# Patient Record
Sex: Female | Born: 1990 | Race: White | Hispanic: No | Marital: Single | Smoking: Never smoker
Health system: Southern US, Community
[De-identification: ages and names within clinical notes are randomized; demographics above are authoritative.]

## PROBLEM LIST (undated history)

## (undated) DIAGNOSIS — N289 Disorder of kidney and ureter, unspecified: Secondary | ICD-10-CM

## (undated) DIAGNOSIS — F419 Anxiety disorder, unspecified: Secondary | ICD-10-CM

## (undated) HISTORY — PX: KIDNEY SURGERY: SHX687

## (undated) HISTORY — PX: CLAVICLE SURGERY: SHX598

## (undated) HISTORY — PX: OTHER SURGICAL HISTORY: SHX169

## (undated) HISTORY — PX: APPENDECTOMY: SHX54

---

## 2012-11-24 ENCOUNTER — Encounter: Payer: Self-pay | Admitting: Emergency Medicine

## 2012-11-24 ENCOUNTER — Emergency Department (INDEPENDENT_AMBULATORY_CARE_PROVIDER_SITE_OTHER)
Admission: EM | Admit: 2012-11-24 | Discharge: 2012-11-24 | Disposition: A | Discharge status: Home / Self Care | Payer: Managed Care, Other (non HMO) | Source: Home / Self Care | Attending: Family Medicine | Admitting: Family Medicine

## 2012-11-24 DIAGNOSIS — N3000 Acute cystitis without hematuria: Secondary | ICD-10-CM

## 2012-11-24 DIAGNOSIS — R300 Dysuria: Secondary | ICD-10-CM

## 2012-11-24 HISTORY — DX: Disorder of kidney and ureter, unspecified: N28.9

## 2012-11-24 LAB — POCT URINALYSIS DIP (MANUAL ENTRY)
Nitrite, UA: NEGATIVE
Protein Ur, POC: NEGATIVE
pH, UA: 6 (ref 5–8)

## 2012-11-24 MED ORDER — PHENAZOPYRIDINE HCL 200 MG PO TABS: 200.0000 mg | ORAL_TABLET | Freq: Three times a day (TID) | ORAL | Status: DC

## 2012-11-24 MED ORDER — SULFAMETHOXAZOLE-TRIMETHOPRIM 800-160 MG PO TABS: 1.0000 | ORAL_TABLET | Freq: Two times a day (BID) | ORAL | Status: DC

## 2012-11-24 NOTE — ED Provider Notes (Signed)
History    CSN: [id number] Arrival date & time 11/24/12  1236  None    Chief Complaint  Patient presents with  . Dysuria      HPI Comments: Patient complains of mild dysuria, frequency, and hesitancy for two days.   She has a history of vesicoureteral reflux and recurrent UTI's.  Her last UTI was about 6 months ago.  No abdominal pain or fevers, chills, and sweats.  No nausea/vomiting   Patient is a 22 y.o. female presenting with frequency. The history is provided by the patient.  Urinary Frequency This is a recurrent problem. The current episode started 2 days ago. The problem occurs constantly. The problem has not changed since onset.Pertinent negatives include no abdominal pain. Nothing aggravates the symptoms. Nothing relieves the symptoms. Treatments tried: cranberry pills. The treatment provided no relief.   Past Medical History  Diagnosis Date  . Renal disorder    Past Surgical History  Procedure Laterality Date  . Appendectomy    . Spleenectomy     No family history on file. History  Substance Use Topics  . Smoking status: Never Smoker   . Smokeless tobacco: Not on file  . Alcohol Use: No   OB History   Grav Para Term Preterm Abortions TAB SAB Ect Mult Living                 Review of Systems  Gastrointestinal: Negative for abdominal pain.  Genitourinary: Positive for frequency.  All other systems reviewed and are negative.    Allergies  Review of patient's allergies indicates no known allergies.  Home Medications   Current Outpatient Rx  Name  Route  Sig  Dispense  Refill  . clonazePAM (KLONOPIN) 0.5 MG tablet   Oral   Take 0.5 mg by mouth 2 (two) times daily as needed for anxiety.         . phenazopyridine (PYRIDIUM) 200 MG tablet   Oral   Take 1 tablet (200 mg total) by mouth 3 (three) times daily. Take with food.   6 tablet   0   . sulfamethoxazole-trimethoprim (BACTRIM DS,SEPTRA DS) 800-160 MG per tablet   Oral   Take 1 tablet by mouth  2 (two) times daily.   10 tablet   0    BP 120/76  Pulse 79  Temp(Src) 98.3 F (36.8 C) (Oral)  Ht 5\' 5"  (1.651 m)  Wt 111 lb (50.349 kg)  BMI 18.47 kg/m2  SpO2 100%  LMP 11/22/2012 Physical Exam Nursing notes and Vital Signs reviewed. Appearance:  Patient appears healthy, stated age, and in no acute distress Eyes:  Pupils are equal, round, and reactive to light and accomodation.  Extraocular movement is intact.  Conjunctivae are not inflamed   Mouth/Pharynx:  Normal; moist mucous membranes  Neck:  Supple.   No adenopathy  Lungs:  Clear to auscultation.  Breath sounds are equal.  Heart:  Regular rate and rhythm without murmurs, rubs, or gallops.  Abdomen:  Nontender without masses or hepatosplenomegaly.  Bowel sounds are present.  No CVA or flank tenderness.  Extremities:  No edema.   Skin:  No rash present.   ED Course  Procedures  none  Labs Reviewed  URINE CULTURE pending  POCT URINALYSIS DIP (MANUAL ENTRY) BLO moderate; LEU trace      1. Dysuria   2. Acute cystitis     MDM  Begin Septra DS, Pyridium.  Urine culture pending.  Increase fluid intake. Followup with Family Doctor  if not improved in one week.   Lattie Haw, MD 11/24/12 [phone]

## 2012-11-24 NOTE — ED Notes (Signed)
Dysuria x 2 days.

## 2012-11-27 LAB — URINE CULTURE

## 2012-11-28 ENCOUNTER — Telehealth: Payer: Self-pay

## 2012-11-28 NOTE — Telephone Encounter (Signed)
Message copied by Chalmers Cater on Fri Nov 28, 2012  4:44 PM ------      Message from: Donna Christen A      Created: Fri Nov 28, 2012  4:42 PM       Stop Septra.  Call in Rx for Cipro 500mg , one BID for 5 days.  Rx #10, no ref.      ----- Message -----         From: Chalmers Cater, CMA         Sent: 11/28/2012  11:18 AM           To: Lattie Haw, MD            Tereasa is on her last day of the Septra and she is still having frequent urination and pressure during urination. She is unable to come in due to the fact she is traveling to the beach.        ------

## 2012-11-28 NOTE — ED Notes (Signed)
Sending a staff message:  She is still having frequent urination along with pressure during urination.

## 2012-11-28 NOTE — ED Notes (Addendum)
Patient advised and medication called in.     Left a message on voice mail asking patient to call back.      Stop Septra. Call in Rx for Cipro 500mg , one BID for 5 days. Rx #10, no ref. ----- Message ----- From: Chalmers Cater, CMA Sent: 11/28/2012 11:18 AM To: Lattie Haw, MD Kiki is on her last day of the Septra and she is still having frequent urination and pressure during urination. She is unable to come in due to the fact she is traveling to the beach.

## 2012-12-03 ENCOUNTER — Emergency Department (INDEPENDENT_AMBULATORY_CARE_PROVIDER_SITE_OTHER)
Admission: EM | Admit: 2012-12-03 | Discharge: 2012-12-03 | Disposition: A | Discharge status: Home / Self Care | Payer: Managed Care, Other (non HMO) | Source: Home / Self Care | Attending: Family Medicine | Admitting: Family Medicine

## 2012-12-03 ENCOUNTER — Encounter: Payer: Self-pay | Admitting: Emergency Medicine

## 2012-12-03 DIAGNOSIS — R300 Dysuria: Secondary | ICD-10-CM

## 2012-12-03 LAB — POCT URINALYSIS DIP (MANUAL ENTRY)
Bilirubin, UA: NEGATIVE
Glucose, UA: NEGATIVE
Ketones, POC UA: NEGATIVE
Leukocytes, UA: NEGATIVE
Nitrite, UA: NEGATIVE
pH, UA: 7 (ref 5–8)

## 2012-12-03 MED ORDER — NITROFURANTOIN MONOHYD MACRO 100 MG PO CAPS: 100.0000 mg | ORAL_CAPSULE | Freq: Two times a day (BID) | ORAL | Status: DC

## 2012-12-03 NOTE — ED Notes (Addendum)
Still having dysuria after having taken Bactrim and Cipro x 2 weeks

## 2012-12-03 NOTE — ED Provider Notes (Signed)
History    CSN: [id number] Arrival date & time 12/03/12  1437  First MD Initiated Contact with Patient 12/03/12 1514     Chief Complaint  Patient presents with  . Dysuria      HPI Comments: Patient reports continued dysuria after having taken Bactrim, and then Cipro (which she finished today).  No abdominal pain.  No fevers, chills, and sweats Patient's last menstrual period was 11/22/2012.  She notes that she has an IUD in place.  Patient is a 22 y.o. female presenting with dysuria. The history is provided by the patient.  Dysuria Pain quality:  Burning Pain severity:  Mild Onset quality:  Gradual Duration:  2 weeks Timing:  Constant Progression:  Unchanged Chronicity:  Recurrent Recent urinary tract infections: yes   Relieved by:  Nothing Ineffective treatments:  Antibiotics Urinary symptoms: frequent urination and hesitancy   Urinary symptoms: no discolored urine, no foul-smelling urine, no hematuria and no bladder incontinence   Associated symptoms: no abdominal pain, no fever, no flank pain, no genital lesions, no nausea, no vaginal discharge and no vomiting   Risk factors comment:  Hx of vesicoureteral reflux  Past Medical History  Diagnosis Date  . Renal disorder    Past Surgical History  Procedure Laterality Date  . Appendectomy    . Spleenectomy     No family history on file. History  Substance Use Topics  . Smoking status: Never Smoker   . Smokeless tobacco: Not on file  . Alcohol Use: No   OB History   Grav Para Term Preterm Abortions TAB SAB Ect Mult Living                 Review of Systems  Constitutional: Negative for fever.  Gastrointestinal: Negative for nausea, vomiting and abdominal pain.  Genitourinary: Positive for dysuria. Negative for flank pain and vaginal discharge.    Allergies  Rocephin  Home Medications   Current Outpatient Rx  Name  Route  Sig  Dispense  Refill  . clonazePAM (KLONOPIN) 0.5 MG tablet   Oral   Take 0.5 mg  by mouth 2 (two) times daily as needed for anxiety.         . nitrofurantoin, macrocrystal-monohydrate, (MACROBID) 100 MG capsule   Oral   Take 1 capsule (100 mg total) by mouth 2 (two) times daily.   14 capsule   0   . phenazopyridine (PYRIDIUM) 200 MG tablet   Oral   Take 1 tablet (200 mg total) by mouth 3 (three) times daily. Take with food.   6 tablet   0   . sulfamethoxazole-trimethoprim (BACTRIM DS,SEPTRA DS) 800-160 MG per tablet   Oral   Take 1 tablet by mouth 2 (two) times daily.   10 tablet   0    BP 126/77  Pulse 80  Temp(Src) 97.7 F (36.5 C) (Oral)  SpO2 99%  LMP 11/22/2012 Physical Exam Nursing notes and Vital Signs reviewed. Appearance:  Patient appears healthy, stated age, and in no acute distress Eyes:  Pupils are equal, round, and reactive to light and accomodation.  Extraocular movement is intact.  Conjunctivae are not inflamed  Pharynx:  Normal Neck:  Supple.  No adenopathy Lungs:  Clear to auscultation.  Breath sounds are equal.  Heart:  Regular rate and rhythm without murmurs, rubs, or gallops.  Abdomen:  Nontender without masses or hepatosplenomegaly.  Bowel sounds are present.  No CVA or flank tenderness.  Extremities:  No edema.  No calf tenderness  Skin:  No rash present.   ED Course  Procedures  None   Labs Reviewed  URINE CULTURE  POCT URINALYSIS DIP (MANUAL ENTRY)  BLO trace intact, otherwise negative  POCT URINE PREGNANCY negative    1. Dysuria, persistent.  History of vesicoureteral reflux     MDM  Begin Macrobid.  Urine culture pending Continue increased fluids.  May take AZO for two days. Followup with urologist  Lattie Haw, MD 12/03/12 2044

## 2012-12-05 ENCOUNTER — Telehealth: Payer: Self-pay | Admitting: Emergency Medicine

## 2012-12-05 LAB — URINE CULTURE: Colony Count: 5000

## 2013-02-11 ENCOUNTER — Emergency Department (INDEPENDENT_AMBULATORY_CARE_PROVIDER_SITE_OTHER)
Admission: EM | Admit: 2013-02-11 | Discharge: 2013-02-11 | Disposition: A | Discharge status: Home / Self Care | Payer: Managed Care, Other (non HMO) | Source: Home / Self Care | Attending: Family Medicine | Admitting: Family Medicine

## 2013-02-11 ENCOUNTER — Encounter: Payer: Self-pay | Admitting: *Deleted

## 2013-02-11 ENCOUNTER — Emergency Department (INDEPENDENT_AMBULATORY_CARE_PROVIDER_SITE_OTHER): Payer: Managed Care, Other (non HMO)

## 2013-02-11 DIAGNOSIS — S83511A Sprain of anterior cruciate ligament of right knee, initial encounter: Secondary | ICD-10-CM

## 2013-02-11 DIAGNOSIS — S83509A Sprain of unspecified cruciate ligament of unspecified knee, initial encounter: Secondary | ICD-10-CM

## 2013-02-11 DIAGNOSIS — M7520 Bicipital tendinitis, unspecified shoulder: Secondary | ICD-10-CM

## 2013-02-11 DIAGNOSIS — M7521 Bicipital tendinitis, right shoulder: Secondary | ICD-10-CM

## 2013-02-11 DIAGNOSIS — IMO0001 Reserved for inherently not codable concepts without codable children: Secondary | ICD-10-CM

## 2013-02-11 MED ORDER — HYDROCODONE-ACETAMINOPHEN 5-325 MG PO TABS: ORAL_TABLET | ORAL | Status: DC

## 2013-02-11 MED ORDER — MELOXICAM 7.5 MG PO TABS: 7.5000 mg | ORAL_TABLET | Freq: Every day | ORAL | Status: DC
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
## 2013-02-18 ENCOUNTER — Institutional Professional Consult (permissible substitution): Payer: Managed Care, Other (non HMO) | Admitting: Sports Medicine

## 2014-05-14 IMAGING — CR DG AC JOINTS*L*
2 series · 2 of 2 positions shown · non-contrast
Comparison: None.

CLINICAL DATA: Injured right clavicle yesterday with pain

LEFT ACROMIOCLAVICULAR JOINTS

[view not recorded (1 of 2)]
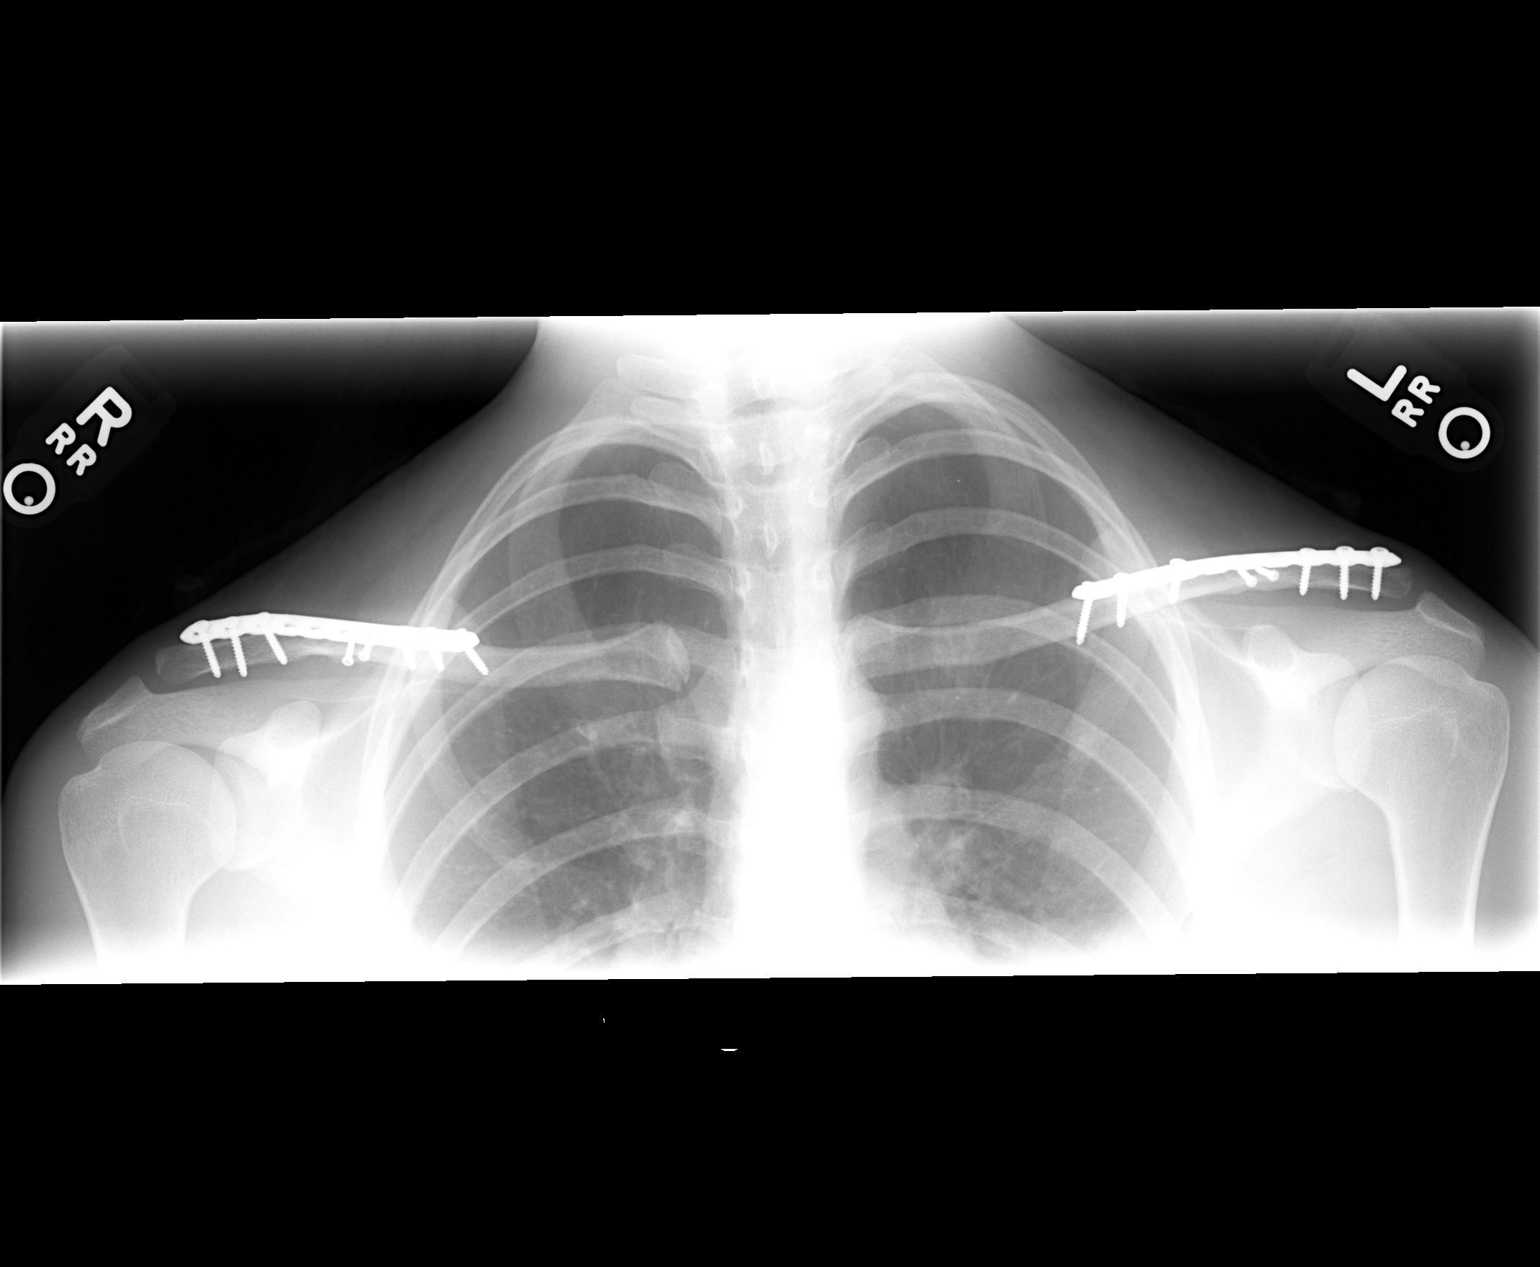

[view not recorded (2 of 2)]
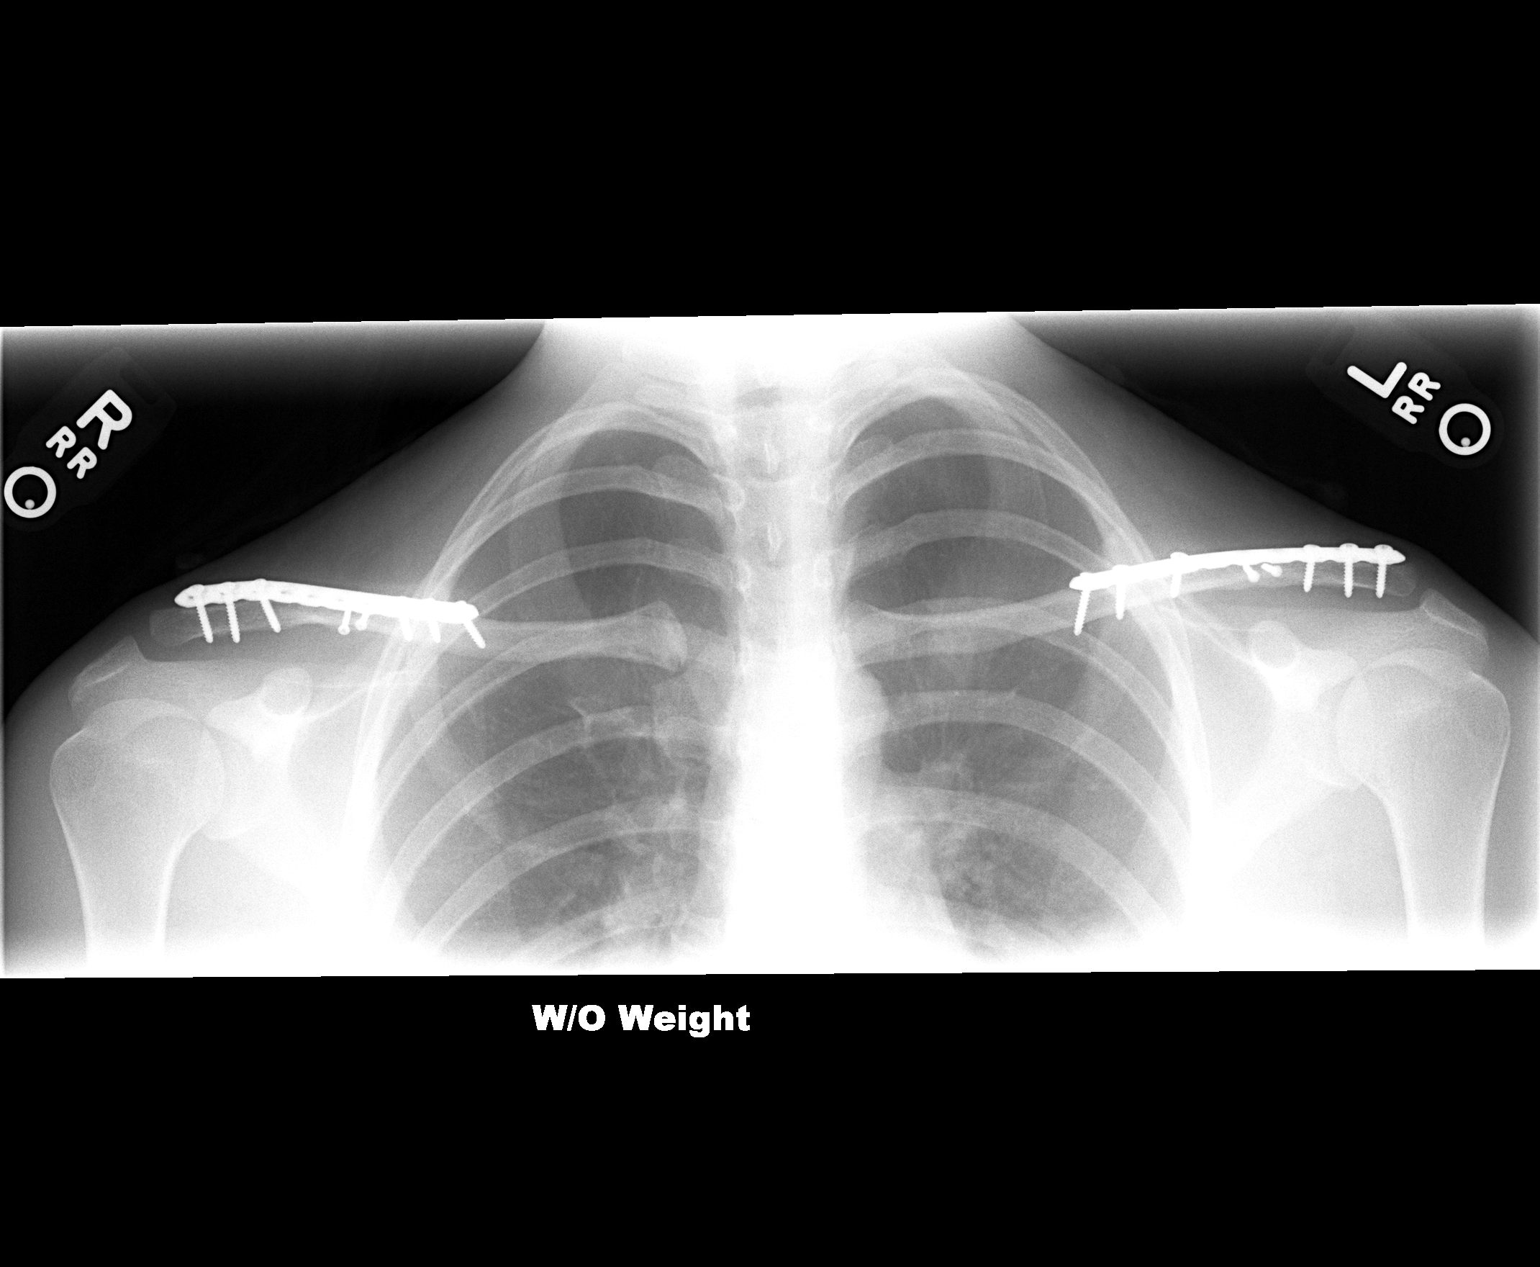

[2 of 2 positions shown; findings below may reference images not displayed]

FINDINGS: Views of the AC joint without and with weights were
obtained.  Metallic fixation plates are noted across both mid
distal clavicles for fixation of prior fractures.  Several screws
are loose within each of these fixation plates.  The AC joints
appear normally aligned.  There is no change in coracoclavicular
distances without and with weights.
IMPRESSION: Negative.

## 2014-10-07 ENCOUNTER — Encounter: Payer: Self-pay | Admitting: Emergency Medicine

## 2014-10-07 ENCOUNTER — Emergency Department
Admission: EM | Admit: 2014-10-07 | Discharge: 2014-10-07 | Disposition: A | Discharge status: Home / Self Care | Payer: Managed Care, Other (non HMO) | Source: Home / Self Care | Attending: Emergency Medicine | Admitting: Emergency Medicine

## 2014-10-07 DIAGNOSIS — A09 Infectious gastroenteritis and colitis, unspecified: Secondary | ICD-10-CM

## 2014-10-07 LAB — POCT INFLUENZA A/B
Influenza A, POC: NEGATIVE
Influenza B, POC: NEGATIVE

## 2014-10-07 MED ORDER — DIPHENOXYLATE-ATROPINE 2.5-0.025 MG PO TABS: 1.0000 | ORAL_TABLET | Freq: Four times a day (QID) | ORAL | Status: DC | PRN

## 2014-10-07 MED ORDER — FLUCONAZOLE 150 MG PO TABS: 150.0000 mg | ORAL_TABLET | Freq: Once | ORAL | Status: DC

## 2014-10-07 MED ORDER — CIPROFLOXACIN HCL 500 MG PO TABS: 500.0000 mg | ORAL_TABLET | Freq: Two times a day (BID) | ORAL | Status: DC

## 2014-10-07 NOTE — ED Notes (Addendum)
Reports onset nausea, vomiting, diarrhea and low grade fever 2 days ago; no further N&V but has had approximately 10 diarrhea stools in past 12 hours. Also has cough. Denies abdominal pain except for cramping with diarrhea.

## 2014-10-07 NOTE — ED Provider Notes (Signed)
CSN: [id number]     Arrival date & time 10/07/14  [id number] History   First MD Initiated Contact with Patient 10/07/14 [phone]     Chief Complaint  Patient presents with  . Diarrhea  . Code Sepsis    Patient is a 24 y.o. female presenting with diarrhea. The history is provided by the patient.  Diarrhea Quality:  Watery Severity:  Severe Onset quality:  Sudden Timing:  Unable to specify Progression:  Unchanged Relieved by:  Nothing Exacerbated by: Eating or drinking anything. Ineffective treatments: Imodium. Associated symptoms: abdominal pain (Episodic, cramping), chills, cough (Minimal), diaphoresis, fever, headaches (Nonfocal) and myalgias   Associated symptoms: no URI   Risk factors: sick contacts   Risk factors: no recent antibiotic use, no suspicious food intake and no travel to endemic areas    2 nights ago, acute nausea and vomiting loose watery diarrheal stools with fever up to 101 with chills and sweats and myalgias. Episodic bilateral lower abdominal crampy pain, mild. She denies chance of pregnancy as she has an IUD and just went to her GYN for annual exam about 5 days ago, and she states all routine tests and GYN exam were normal. Had 10 loose diarrhea stools in the past 12 hours. She is able to tolerate by mouth clear liquids and small amount of solids. Has mild nausea but vomiting resolved the past 12 hours. Has minimal nonproductive cough. No other URI symptoms. No shortness of breath or chest pain. Her 24-year-old son is in daycare, and he's been exposed to another child with febrile illness  Past Medical History  Diagnosis Date  . Renal disorder    Past Surgical History  Procedure Laterality Date  . Appendectomy    . Spleenectomy    . Cesarean section    . Clavicle surgery    . Kidney surgery     History reviewed. No pertinent family history. History  Substance Use Topics  . Smoking status: Never Smoker   . Smokeless tobacco: Never Used  . Alcohol Use: No   OB  History    No data available     Review of Systems  Constitutional: Positive for fever, chills and diaphoresis.  Gastrointestinal: Positive for abdominal pain (Episodic, cramping) and diarrhea.  Musculoskeletal: Positive for myalgias.  Neurological: Positive for headaches (Nonfocal).  Remainder of Review of Systems negative for acute change except as noted in the HPI. No urinary symptoms  Allergies  Rocephin  Home Medications   Prior to Admission medications   Medication Sig Start Date End Date Taking? Authorizing Provider  ciprofloxacin (CIPRO) 500 MG tablet Take 1 tablet (500 mg total) by mouth 2 (two) times daily. For 5 days 10/07/14   Lajean Manesavid Massey, MD  clonazePAM (KLONOPIN) 0.5 MG tablet Take 0.5 mg by mouth 2 (two) times daily as needed for anxiety.    Historical Provider, MD  diphenoxylate-atropine (LOMOTIL) 2.5-0.025 MG per tablet Take 1 tablet by mouth 4 (four) times daily as needed for diarrhea or loose stools. 10/07/14   Lajean Manesavid Massey, MD  fluconazole (DIFLUCAN) 150 MG tablet Take 1 tablet (150 mg total) by mouth once. Take 1 now, then may repeat x1 in 4 days, for yeast infection. 10/07/14   Lajean Manesavid Massey, MD   BP 103/69 mmHg  Pulse 108  Temp(Src) 98.1 F (36.7 C) (Oral)  Resp 16  Ht 5\' 4"  (1.626 m)  Wt 116 lb (52.617 kg)  BMI 19.90 kg/m2  SpO2 100% Physical Exam  Constitutional: She is oriented to person,  place, and time. She appears well-developed and well-nourished.  Non-toxic appearance. No distress.  Appears fatigued, but no acute distress. Pleasant, cooperative.  HENT:  Head: Normocephalic and atraumatic.  Nose: Nose normal.  Mouth/Throat: Oropharynx is clear and moist.  Oropharynx clear. Some moisture on mucous membranes. No lesions.  Eyes: Pupils are equal, round, and reactive to light. No scleral icterus.  Neck: Normal range of motion. Neck supple. No JVD present.  Cardiovascular: Normal rate, regular rhythm and normal heart sounds.   No murmur  heard. Pulmonary/Chest: Effort normal and breath sounds normal.  Abdominal: Soft. She exhibits no distension, no abdominal bruit and no mass. Bowel sounds are increased. There is no hepatosplenomegaly. There is tenderness (Minimal diffuse tenderness). There is no rebound, no guarding and no CVA tenderness.  Musculoskeletal: Normal range of motion. She exhibits no edema or tenderness.  Lymphadenopathy:    She has no cervical adenopathy.  Neurological: She is alert and oriented to person, place, and time.  Skin: No rash noted.  Psychiatric: She has a normal mood and affect.  Nursing note and vitals reviewed.   Urgent care Course 9:02 AM. She declined antinausea med in urgent care at this time. Rapid flu test ordered   Procedures (including critical care time) Labs Review Labs Reviewed  POCT INFLUENZA A/B   rapid flu tests negative   Imaging Review No results found.   MDM   1. Infectious diarrhea in adult patient    possibly viral cause, possibly bacterial. Clinically, she is not severely dehydrated and can oral rehydrate, and she denies any nausea now and can tolerate by mouth clear liquids. Treatment options discussed, as well as risks, benefits, alternatives. Patient voiced understanding and agreement with the following plans: Bacterial coverage with Cipro. Lomotil for severe diarrhea, but precautions discussed. Diflucan prescribed at her request in case she gets yeast infection from the antibiotic. New Prescriptions   CIPROFLOXACIN (CIPRO) 500 MG TABLET    Take 1 tablet (500 mg total) by mouth 2 (two) times daily. For 5 days   DIPHENOXYLATE-ATROPINE (LOMOTIL) 2.5-0.025 MG PER TABLET    Take 1 tablet by mouth 4 (four) times daily as needed for diarrhea or loose stools.   FLUCONAZOLE (DIFLUCAN) 150 MG TABLET    Take 1 tablet (150 mg total) by mouth once. Take 1 now, then may repeat x1 in 4 days, for yeast infection.   handout given on diarrhea. Follow-up with your primary care  doctor in 2-3 days if not improving. ER  if symptoms become worse or any red flags. Precautions discussed. Red flags discussed. Questions invited and answered. Patient voiced understanding and agreement.     Lajean Manesavid Massey, MD 10/07/14 [phone]

## 2015-06-16 ENCOUNTER — Encounter: Payer: Self-pay | Admitting: *Deleted

## 2015-06-16 ENCOUNTER — Emergency Department (INDEPENDENT_AMBULATORY_CARE_PROVIDER_SITE_OTHER)
Admission: EM | Admit: 2015-06-16 | Discharge: 2015-06-16 | Disposition: A | Discharge status: Home / Self Care | Payer: Managed Care, Other (non HMO) | Source: Home / Self Care | Attending: Family Medicine | Admitting: Family Medicine

## 2015-06-16 DIAGNOSIS — J069 Acute upper respiratory infection, unspecified: Secondary | ICD-10-CM | POA: Diagnosis not present

## 2015-06-16 DIAGNOSIS — J0100 Acute maxillary sinusitis, unspecified: Secondary | ICD-10-CM

## 2015-06-16 MED ORDER — AMOXICILLIN-POT CLAVULANATE 875-125 MG PO TABS: 1.0000 | ORAL_TABLET | Freq: Two times a day (BID) | ORAL | Status: DC

## 2015-06-16 MED ORDER — BENZONATATE 100 MG PO CAPS: 100.0000 mg | ORAL_CAPSULE | Freq: Three times a day (TID) | ORAL | Status: DC

## 2015-06-16 NOTE — ED Provider Notes (Signed)
CSN: [id number]     Arrival date & time 06/16/15  0935 History   First MD Initiated Contact with Patient 06/16/15 [phone]     Chief Complaint  Patient presents with  . Cough   (Consider location/radiation/quality/duration/timing/severity/associated sxs/prior Treatment) HPI  Pt is a 25yo female presenting to [facility] with c/o mildly productive cough, with associated nasal congestion, body aches, frontal headache with sinus pressure, bilateral ear pain and fever Tmax 101.  Symptoms started 10 days ago.  She did take Tylenol around 0700 this morning.  She also reports 2-3 episodes of loose stool since yesterday.  Denies nausea or vomiting. She did not get the flu vaccine this year. She notes her son has had nasal congestion but no fevers.  Denies recent travel.   Past Medical History  Diagnosis Date  . Renal disorder    Past Surgical History  Procedure Laterality Date  . Appendectomy    . Spleenectomy    . Cesarean section    . Clavicle surgery    . Kidney surgery     History reviewed. No pertinent family history. Social History  Substance Use Topics  . Smoking status: Never Smoker   . Smokeless tobacco: Never Used  . Alcohol Use: No   OB History    No data available     Review of Systems  Constitutional: Positive for fever, chills and fatigue.  HENT: Positive for congestion, ear discharge, ear pain, rhinorrhea, sinus pressure, sneezing, sore throat and voice change. Negative for trouble swallowing.   Respiratory: Positive for cough. Negative for shortness of breath.   Cardiovascular: Negative for chest pain and palpitations.  Gastrointestinal: Positive for diarrhea. Negative for nausea, vomiting and abdominal pain.  Musculoskeletal: Positive for myalgias and arthralgias. Negative for back pain.  Skin: Negative for rash.    Allergies  Rocephin  Home Medications   Prior to Admission medications   Medication Sig Start Date End Date Taking? Authorizing Provider   amoxicillin-clavulanate (AUGMENTIN) 875-125 MG tablet Take 1 tablet by mouth 2 (two) times daily. One po bid x 7 days 06/16/15   Junius Finner, PA-C  benzonatate (TESSALON) 100 MG capsule Take 1 capsule (100 mg total) by mouth every 8 (eight) hours. 06/16/15   Junius Finner, PA-C  clonazePAM (KLONOPIN) 0.5 MG tablet Take 0.5 mg by mouth 2 (two) times daily as needed for anxiety.    Historical Provider, MD   Meds Ordered and Administered this Visit  Medications - No data to display  BP 118/78 mmHg  Pulse 98  Temp(Src) 98 F (36.7 C) (Oral)  Resp 16  Ht  (1.651 m)  Wt 111 lb (50.349 kg)  BMI 18.47 kg/m2  SpO2 99% No data found.   Physical Exam  Constitutional: She appears well-developed and well-nourished. No distress.  HENT:  Head: Normocephalic and atraumatic.  Right Ear: Hearing, tympanic membrane, external ear and ear canal normal.  Left Ear: Hearing, tympanic membrane, external ear and ear canal normal.  Nose: Mucosal edema present. Right sinus exhibits maxillary sinus tenderness and frontal sinus tenderness. Left sinus exhibits maxillary sinus tenderness. Left sinus exhibits no frontal sinus tenderness.  Mouth/Throat: Uvula is midline. Posterior oropharyngeal erythema present.  Eyes: Conjunctivae are normal. No scleral icterus.  Neck: Normal range of motion. Neck supple.  Cardiovascular: Normal rate, regular rhythm and normal heart sounds.   Pulmonary/Chest: Effort normal and breath sounds normal. No respiratory distress. She has no wheezes. She has no rales. She exhibits no tenderness.  Abdominal: Soft. She  exhibits no distension and no mass. There is no tenderness. There is no rebound and no guarding.  Musculoskeletal: Normal range of motion.  Neurological: She is alert.  Skin: Skin is warm and dry. She is not diaphoretic.  Nursing note and vitals reviewed.   ED Course  Procedures (including critical care time)  Labs Review Labs Reviewed - No data to  display  Imaging Review No results found.    MDM   1. Acute maxillary sinusitis, recurrence not specified   2. Acute upper respiratory infection    Pt c/o 10 days of nasal congestion, sinus pressure, cough and fever, Tmax 101.  Exam c/w maxillary sinusitis.  Rx: augmentin and tessalon  Advised pt to use acetaminophen and ibuprofen as needed for fever and pain. Encouraged rest and fluids. F/u with PCP in 7-10 days if not improving, sooner if worsening. Pt verbalized understanding and agreement with tx plan.     Junius Finner, PA-C 06/16/15 1022

## 2015-06-16 NOTE — Discharge Instructions (Signed)
You may take 400-600mg Ibuprofen (Motrin) every 6-8 hours for fever and pain  °Alternate with Tylenol  °You may take 500mg Tylenol every 4-6 hours as needed for fever and pain  °Follow-up with your primary care provider next week for recheck of symptoms if not improving.  °Be sure to drink plenty of fluids and rest, at least 8hrs of sleep a night, preferably more while you are sick. °Return urgent care or go to closest ER if you cannot keep down fluids/signs of dehydration, fever not reducing with Tylenol, difficulty breathing/wheezing, stiff neck, worsening condition, or other concerns (see below)  °Please take antibiotics as prescribed and be sure to complete entire course even if you start to feel better to ensure infection does not come back. ° ° °Cool Mist Vaporizers °Vaporizers may help relieve the symptoms of a cough and cold. They add moisture to the air, which helps mucus to become thinner and less sticky. This makes it easier to breathe and cough up secretions. Cool mist vaporizers do not cause serious burns like hot mist vaporizers, which may also be called steamers or humidifiers. Vaporizers have not been proven to help with colds. You should not use a vaporizer if you are allergic to mold. °HOME CARE INSTRUCTIONS °· Follow the package instructions for the vaporizer. °· Do not use anything other than distilled water in the vaporizer. °· Do not run the vaporizer all of the time. This can cause mold or bacteria to grow in the vaporizer. °· Clean the vaporizer after each time it is used. °· Clean and dry the vaporizer well before storing it. °· Stop using the vaporizer if worsening respiratory symptoms develop. °  °This information is not intended to replace advice given to you by your health care provider. Make sure you discuss any questions you have with your health care provider. °  °Document Released: 02/09/2004 Document Revised: 05/19/2013 Document Reviewed: 10/01/2012 °Elsevier Interactive Patient  Education ©2016 [employer] ° °

## 2015-06-16 NOTE — ED Notes (Signed)
Pt c/o productive cough, nasal congestion, HA and fever 101 x 10 days. She took Tylenol at 0700 today.

## 2015-09-02 ENCOUNTER — Encounter: Payer: Self-pay | Admitting: *Deleted

## 2015-09-02 ENCOUNTER — Emergency Department
Admission: EM | Admit: 2015-09-02 | Discharge: 2015-09-02 | Disposition: A | Discharge status: Home / Self Care | Payer: Managed Care, Other (non HMO) | Source: Home / Self Care | Attending: Family Medicine | Admitting: Family Medicine

## 2015-09-02 ENCOUNTER — Emergency Department (INDEPENDENT_AMBULATORY_CARE_PROVIDER_SITE_OTHER): Payer: Managed Care, Other (non HMO)

## 2015-09-02 DIAGNOSIS — Z975 Presence of (intrauterine) contraceptive device: Secondary | ICD-10-CM | POA: Diagnosis not present

## 2015-09-02 DIAGNOSIS — N839 Noninflammatory disorder of ovary, fallopian tube and broad ligament, unspecified: Secondary | ICD-10-CM

## 2015-09-02 DIAGNOSIS — N83201 Unspecified ovarian cyst, right side: Secondary | ICD-10-CM

## 2015-09-02 DIAGNOSIS — R1031 Right lower quadrant pain: Secondary | ICD-10-CM

## 2015-09-02 DIAGNOSIS — N838 Other noninflammatory disorders of ovary, fallopian tube and broad ligament: Secondary | ICD-10-CM

## 2015-09-02 HISTORY — DX: Anxiety disorder, unspecified: F41.9

## 2015-09-02 LAB — POCT URINALYSIS DIP (MANUAL ENTRY)
Glucose, UA: NEGATIVE
Leukocytes, UA: NEGATIVE
Nitrite, UA: NEGATIVE
Protein Ur, POC: 100 — AB
Spec Grav, UA: 1.02 (ref 1.005–1.03)
Urobilinogen, UA: 0.2 (ref 0–1)
pH, UA: 6.5 (ref 5–8)

## 2015-09-02 LAB — POCT URINE PREGNANCY: Preg Test, Ur: NEGATIVE

## 2015-09-02 LAB — POCT CBC W AUTO DIFF (K'VILLE URGENT CARE)

## 2015-09-02 MED ORDER — IBUPROFEN 600 MG PO TABS: 600.0000 mg | ORAL_TABLET | Freq: Four times a day (QID) | ORAL | Status: AC | PRN

## 2015-09-02 MED ORDER — ONDANSETRON HCL 4 MG PO TABS
4.0000 mg | ORAL_TABLET | Freq: Once | ORAL | Status: AC
  Administered 2015-09-02: 4 mg via ORAL

## 2015-09-02 MED ORDER — HYDROCODONE-ACETAMINOPHEN 5-325 MG PO TABS: 1.0000 | ORAL_TABLET | Freq: Four times a day (QID) | ORAL | Status: AC | PRN

## 2015-09-02 NOTE — Discharge Instructions (Signed)
Norco/Vicodin (hydrocodone-acetaminophen) is a narcotic pain medication, do not combine these medications with others containing tylenol. While taking, do not drink alcohol, drive, or perform any other activities that requires focus while taking these medications.   Please be sure to continue taking your antibiotics as prescribed.  Follow up with your OB/GYN by next week for further evaluation of symptoms and to discuss today's ultrasound findings. It is recommended you get a repeat pelvic ultrasound in 6-12 weeks, preferably 1 week after your normal menses.   You should also discuss having your IUD removed as this may be contributing to some of your pelvic discomfort.  Your OB/GYN may take a biopsy of the ovarian cyst/mass or they may decide to have the cyst removed to help relieve your pain.

## 2015-09-02 NOTE — ED Notes (Signed)
Pt c/o RLQ abd pain radiating to her back with pelvic pain x 1 month. Hx of UTI, pyelonephritis and kidney stones. She is currently taking cipro and doxycline.

## 2015-09-02 NOTE — ED Provider Notes (Signed)
CSN: [id number]     Arrival date & time 09/02/15  0950 History   First MD Initiated Contact with Patient 09/02/15 1007     Chief Complaint  Patient presents with  . Abdominal Pain   (Consider location/radiation/quality/duration/timing/severity/associated sxs/prior Treatment) HPI  The pt is a 25yo female presenting to [facility] with c/o severe Right lower quadrant abdominal pain that started about 1 month ago, radiates to her back.  Pt was admitted at [facility] last week and diagnosed with non-obstructing kidney stones and pyelonephritis.  She is currently on ciprofloxacin and doxycycline but has had nausea and vomiting due to the antibiotics.  She took Tylenol this morning without relief of pain. Pain is aching, sharp, and cramping, 10/10 at worst. Abdominal surgical hx significant for appendectomy, splenectomy, c-section and a kidney surgery.  She is to f/u with urology as needed, no current appointments scheduled.  She was advised to f/u with OB/GYN but has not.  Denies urinary or vaginal symptoms.  She does have an IUD in place.  Last CT abdominal scan last week showed bilateral punctate renal calculi and a prominent Right ovary with scattered follicles but no evidence of surgical abdomen.   Past Medical History  Diagnosis Date  . Renal disorder   . Anxiety    Past Surgical History  Procedure Laterality Date  . Appendectomy    . Spleenectomy    . Cesarean section    . Clavicle surgery    . Kidney surgery     Family History  Problem Relation Age of Onset  . Cancer Mother     ovarian  . Anxiety disorder Father   . Hypertension Father    Social History  Substance Use Topics  . Smoking status: Never Smoker   . Smokeless tobacco: Never Used  . Alcohol Use: No   OB History    No data available     Review of Systems  Constitutional: Negative for fever and chills.  Gastrointestinal: Positive for nausea, vomiting and abdominal pain ( RLQ ). Negative for diarrhea.   Genitourinary: Positive for flank pain ( Right). Negative for dysuria, urgency, frequency, hematuria, decreased urine volume, vaginal bleeding, vaginal discharge, vaginal pain, menstrual problem and pelvic pain.  Musculoskeletal: Positive for back pain ( Right side). Negative for myalgias and arthralgias.  Skin: Negative for rash.    Allergies  Rocephin  Home Medications   Prior to Admission medications   Medication Sig Start Date End Date Taking? Authorizing Provider  ciprofloxacin (CIPRO) 250 MG tablet Take 250 mg by mouth 2 (two) times daily.   Yes Historical Provider, MD  doxycycline (VIBRAMYCIN) 100 MG capsule Take 100 mg by mouth 2 (two) times daily.   Yes Historical Provider, MD  clonazePAM (KLONOPIN) 0.5 MG tablet Take 0.5 mg by mouth 2 (two) times daily as needed for anxiety.    Historical Provider, MD  HYDROcodone-acetaminophen (NORCO/VICODIN) 5-325 MG tablet Take 1-2 tablets by mouth every 6 (six) hours as needed for moderate pain or severe pain. 09/02/15   Junius Finner, PA-C  ibuprofen (ADVIL,MOTRIN) 600 MG tablet Take 1 tablet (600 mg total) by mouth every 6 (six) hours as needed. 09/02/15   Junius Finner, PA-C   Meds Ordered and Administered this Visit   Medications  ondansetron (ZOFRAN) tablet 4 mg (4 mg Oral Given 09/02/15 1029)    BP 113/77 mmHg  Pulse 120  Temp(Src) 97.8 F (36.6 C) (Oral)  Resp 16  Ht  (1.626 m)  Wt 98  lb (44.453 kg)  BMI 16.81 kg/m2  SpO2 100% No data found.   Physical Exam  Constitutional: She appears well-developed and well-nourished. No distress.  HENT:  Head: Normocephalic and atraumatic.  Mouth/Throat: Oropharynx is clear and moist.  Eyes: Conjunctivae are normal. No scleral icterus.  Neck: Normal range of motion. Neck supple.  Cardiovascular: Normal rate, regular rhythm and normal heart sounds.   Pulmonary/Chest: Effort normal and breath sounds normal. No respiratory distress. She has no wheezes. She has no rales. She exhibits  no tenderness.  Abdominal: Soft. Bowel sounds are normal. She exhibits no distension and no mass. There is tenderness in the right lower quadrant. There is guarding and CVA tenderness (Right). There is no rebound.  Musculoskeletal: Normal range of motion.  Neurological: She is alert.  Skin: Skin is warm and dry. She is not diaphoretic.  Nursing note and vitals reviewed.   ED Course  Procedures (including critical care time)  Labs Review Labs Reviewed  POCT URINALYSIS DIP (MANUAL ENTRY) - Abnormal; Notable for the following:    Bilirubin, UA small (*)    Ketones, POC UA small (15) (*)    Blood, UA trace-intact (*)    Protein Ur, POC =100 (*)    All other components within normal limits  POCT URINE PREGNANCY  POCT CBC W AUTO DIFF ([facility])    Imaging Review US Transvaginal Non-ob  09/02/2015  CLINICAL DATA:  Right lower quadrant/ pelvic pain EXAM: TRANSABDOMINAL AND TRANSVAGINAL ULTRASOUND OF PELVIS DOPPLER ULTRASOUND OF OVARIES TECHNIQUE: Study was performed transabdominally to optimize pelvic field of view evaluation and transvaginally to optimize internal visceral architecture evaluation. Color and duplex Doppler ultrasound was utilized to evaluate blood flow to the ovaries. COMPARISON:  None. FINDINGS: Uterus Measurements: 6.7 x 3.1 x 5.8 cm. No fibroids or other mass visualized. There are prominent vascular structures within the uterus. Endometrium Thickness: 7 mm. No focal abnormality visualized. Intrauterine device is present within the endometrium. Right ovary Measurements: 6.0 x 3.3 x 5.2 cm. There is a complex predominantly cystic lesion arising from the right ovary measuring 4.4 x 3.2 x 3.9 cm. Left ovary Measurements: 3.8 x 1.9 x 1.6 cm. Normal appearance/no adnexal mass. Pulsed Doppler evaluation of both ovaries demonstrates normal low-resistance arterial and venous waveforms. Other findings No abnormal free fluid. IMPRESSION: Complex predominantly cystic mass arising  from the right ovary measuring 4.4 x 3.2 x 3.9 cm. Hemorrhagic cyst is the most likely etiology, although infected cyst or early cystic neoplasm are differential considerations. Further surveillance advised. Short-interval follow up ultrasound in 6-12 weeks is recommended, preferably during the week following the patient's normal menses. Left ovary appears normal. No ovarian torsion on either side. Intrauterine device in endometrium. No intrauterine mass. Prominent vascular structures within the uterus are noted, a finding of questionable etiology. Particular attention to this area on subsequent evaluations is advised. Electronically Signed   By: Bretta Bang III M.D.   On: 09/02/2015 12:03   US Pelvis Complete  09/02/2015  CLINICAL DATA:  Right lower quadrant pain . EXAM: TRANSABDOMINAL AND TRANSVAGINAL ULTRASOUND OF PELVIS TECHNIQUE: Both transabdominal and transvaginal ultrasound examinations of the pelvis were performed. Transabdominal technique was performed for global imaging of the pelvis including uterus, ovaries, adnexal regions, and pelvic cul-de-sac. It was necessary to proceed with endovaginal exam following the transabdominal exam to visualize the uterus and ovaries. COMPARISON:  No recent prior. FINDINGS: Uterus Measurements: 6.7 x 3.1 x 5.8 cm. No fibroids or other mass visualized. Endometrium  Thickness: 6.9 mm.  IUD noted in the endometrial canal. Right ovary Measurements: 6.0 x 3.3 x 5.2 cm. 4.4 x 3.2 x 3.9 cm complex cystic mass. Left ovary Measurements: 3.8 x 1.9 x 1.6 cm. Normal appearance/no adnexal mass. Other findings No abnormal free fluid. IMPRESSION: 1. 4.4 x 3.2 x 3.9 cm right ovarian complex cystic mass. Pregnancy test suggested to exclude ectopic pregnancy. This could be a benign process such as hemorrhagic cyst however follow-up is needed to exclude a persistent mass lesion. Short-interval follow up ultrasound in 6-12 weeks is recommended, preferably during the week following the  patient's normal menses. 2.  IUD noted in the endometrial canal. Electronically Signed   By: Maisie Fushomas  Register   On: 09/02/2015 12:04   [place] Art/ven Flow Abd Pelv Doppler  09/02/2015  CLINICAL DATA:  Right lower quadrant/ pelvic pain EXAM: TRANSABDOMINAL AND TRANSVAGINAL ULTRASOUND OF PELVIS DOPPLER ULTRASOUND OF OVARIES TECHNIQUE: Study was performed transabdominally to optimize pelvic field of view evaluation and transvaginally to optimize internal visceral architecture evaluation. Color and duplex Doppler ultrasound was utilized to evaluate blood flow to the ovaries. COMPARISON:  None. FINDINGS: Uterus Measurements: 6.7 x 3.1 x 5.8 cm. No fibroids or other mass visualized. There are prominent vascular structures within the uterus. Endometrium Thickness: 7 mm. No focal abnormality visualized. Intrauterine device is present within the endometrium. Right ovary Measurements: 6.0 x 3.3 x 5.2 cm. There is a complex predominantly cystic lesion arising from the right ovary measuring 4.4 x 3.2 x 3.9 cm. Left ovary Measurements: 3.8 x 1.9 x 1.6 cm. Normal appearance/no adnexal mass. Pulsed Doppler evaluation of both ovaries demonstrates normal low-resistance arterial and venous waveforms. Other findings No abnormal free fluid. IMPRESSION: Complex predominantly cystic mass arising from the right ovary measuring 4.4 x 3.2 x 3.9 cm. Hemorrhagic cyst is the most likely etiology, although infected cyst or early cystic neoplasm are differential considerations. Further surveillance advised. Short-interval follow up ultrasound in 6-12 weeks is recommended, preferably during the week following the patient's normal menses. Left ovary appears normal. No ovarian torsion on either side. Intrauterine device in endometrium. No intrauterine mass. Prominent vascular structures within the uterus are noted, a finding of questionable etiology. Particular attention to this area on subsequent evaluations is advised. Electronically Signed   By:  Bretta BangWilliam  Woodruff III M.D.   On: 09/02/2015 12:03      MDM   1. Right ovarian cyst   2. RLQ abdominal pain   3. Ovarian mass, right    Pt c/o RLQ abdominal pain that has been intermittent for about 1 month. Recent dx of pyelonephritis. Currently on doxycycline and ciprofloxacin.  Pt did have an abdominal CT [facility] as well as a Lumbar MRI. CT did show tiny, non-obstructing renal calculi and a prominent Right ovary with scattered follicles.  She has not had a pelvic U/S yet.    Pt took acetaminophen this morning. Declined Toradol and ibuprofen in UC.    UA: no evidence of UTI Urine preg: NEGATIVE   Pelvic U/S: no evidence of ovarian torsion.  Imaging significant for a Right ovarian mass- DDx: hemorrhagic cyst, infected cyst, or early cystic neoplasm.  Recommended f/u pelvic U/S in 6-12 weeks.  Discussed imaging with pt and provided pt copy of U/S findings so she can bring to her OB/GYN.  Strongly encourage pt to f/u with OB/GYN by next week to discuss today's findings as biopsy may be performed or removal of cyst all together if  pain persists.  Rx: Norco and ibuprofen. Encouraged to complete her antibiotics as previously prescribed.  Patient counseled on use of narcotic pain medications. Counseled not to combine these medications with others containing tylenol. Urged not to drink alcohol, drive, or perform any other activities that requires focus while taking these medications.  Patient verbalized understanding and agreement with treatment plan.   Junius Finner, PA-C 09/02/15 1224

## 2015-09-04 ENCOUNTER — Telehealth: Payer: Self-pay

## 2015-09-05 ENCOUNTER — Telehealth: Payer: Self-pay | Admitting: *Deleted

## 2015-09-20 ENCOUNTER — Ambulatory Visit (INDEPENDENT_AMBULATORY_CARE_PROVIDER_SITE_OTHER): Payer: Managed Care, Other (non HMO)

## 2015-09-20 ENCOUNTER — Other Ambulatory Visit: Payer: Self-pay | Admitting: Obstetrics and Gynecology

## 2015-09-20 ENCOUNTER — Other Ambulatory Visit (HOSPITAL_COMMUNITY): Payer: Self-pay | Admitting: Obstetrics and Gynecology

## 2015-09-20 DIAGNOSIS — N83201 Unspecified ovarian cyst, right side: Secondary | ICD-10-CM

## 2015-10-08 ENCOUNTER — Encounter: Payer: Self-pay | Admitting: Emergency Medicine

## 2015-10-08 ENCOUNTER — Emergency Department (INDEPENDENT_AMBULATORY_CARE_PROVIDER_SITE_OTHER)
Admission: EM | Admit: 2015-10-08 | Discharge: 2015-10-08 | Disposition: A | Discharge status: Home / Self Care | Payer: Managed Care, Other (non HMO) | Source: Home / Self Care | Attending: Family Medicine | Admitting: Family Medicine

## 2015-10-08 DIAGNOSIS — K648 Other hemorrhoids: Secondary | ICD-10-CM | POA: Diagnosis not present

## 2015-10-08 DIAGNOSIS — K644 Residual hemorrhoidal skin tags: Secondary | ICD-10-CM

## 2015-10-08 MED ORDER — HYDROCODONE-ACETAMINOPHEN 5-325 MG PO TABS: 1.0000 | ORAL_TABLET | Freq: Four times a day (QID) | ORAL | Status: AC | PRN

## 2015-10-08 NOTE — ED Provider Notes (Signed)
CSN: [id number]     Arrival date & time 10/08/15  1542 History   First MD Initiated Contact with Patient 10/08/15 1551     Chief Complaint  Patient presents with  . Hemorrhoids  . Cyst   (Consider location/radiation/quality/duration/timing/severity/associated sxs/prior Treatment) HPI The pt is a 25yo female presenting to [facility] with c/o worsening hemorrhoids for 3-4 days and one episode of diarrhea. She states pain is so bad it is difficult to have any bowel movement now and difficult to find a comfortable position to sit. She has tried preparation H suppositories but cannot use due to the severe pain. She also c/o intermittent pain from her uterine cysts.  Denies fever, chills, n/v/d.    Past Medical History  Diagnosis Date  . Renal disorder   . Anxiety    Past Surgical History  Procedure Laterality Date  . Appendectomy    . Spleenectomy    . Cesarean section    . Clavicle surgery    . Kidney surgery     Family History  Problem Relation Age of Onset  . Cancer Mother     ovarian  . Anxiety disorder Father   . Hypertension Father    Social History  Substance Use Topics  . Smoking status: Never Smoker   . Smokeless tobacco: Never Used  . Alcohol Use: No   OB History    No data available     Review of Systems  Constitutional: Negative for fever and chills.  Gastrointestinal: Positive for anal bleeding ( hemorrhoids). Negative for nausea, vomiting and blood in stool.  Genitourinary: Positive for pelvic pain ( chronic). Negative for dysuria, frequency, hematuria and flank pain.  Musculoskeletal: Negative for myalgias and arthralgias.    Allergies  Rocephin  Home Medications   Prior to Admission medications   Medication Sig Start Date End Date Taking? Authorizing Provider  ciprofloxacin (CIPRO) 250 MG tablet Take 250 mg by mouth 2 (two) times daily.    Historical Provider, MD  clonazePAM (KLONOPIN) 0.5 MG tablet Take 0.5 mg by mouth 2 (two) times daily as needed for  anxiety.    Historical Provider, MD  doxycycline (VIBRAMYCIN) 100 MG capsule Take 100 mg by mouth 2 (two) times daily.    Historical Provider, MD  HYDROcodone-acetaminophen (NORCO/VICODIN) 5-325 MG tablet Take 1-2 tablets by mouth every 6 (six) hours as needed for moderate pain or severe pain. 09/02/15   Junius FinnerErin O'Malley, PA-C  HYDROcodone-acetaminophen (NORCO/VICODIN) 5-325 MG tablet Take 1 tablet by mouth every 6 (six) hours as needed for moderate pain or severe pain. 10/08/15   Junius FinnerErin O'Malley, PA-C  ibuprofen (ADVIL,MOTRIN) 600 MG tablet Take 1 tablet (600 mg total) by mouth every 6 (six) hours as needed. 09/02/15   Junius FinnerErin O'Malley, PA-C   Meds Ordered and Administered this Visit  Medications - No data to display  BP 105/72 mmHg  Pulse 100  Temp(Src) 99.1 F (37.3 C) (Oral)  Resp 18  Ht 5\' 5"  (1.651 m)  Wt 105 lb (47.628 kg)  BMI 17.47 kg/m2  SpO2 98% No data found.   Physical Exam  Constitutional: She is oriented to person, place, and time. She appears well-developed and well-nourished.  HENT:  Head: Normocephalic and atraumatic.  Eyes: EOM are normal.  Neck: Normal range of motion.  Cardiovascular: Normal rate.   Pulmonary/Chest: Effort normal.  Abdominal: Soft. She exhibits no distension and no mass. There is tenderness. There is no rebound and no guarding.  Genitourinary: Rectal exam shows external hemorrhoid, internal  hemorrhoid and tenderness. Rectal exam shows anal tone normal.  Chaperoned exam. Grape sided hardened tender hemorrhoid. Pea sided tender external hemorrhoid. No active bleeding.  No surrounding erythema or warmth.   Musculoskeletal: Normal range of motion.  Neurological: She is alert and oriented to person, place, and time.  Skin: Skin is warm and dry.  Psychiatric: She has a normal mood and affect. Her behavior is normal.  Nursing note and vitals reviewed.   ED Course  .Marland KitchenIncision and Drainage Date/Time: 10/10/2015 8:16 AM Performed by: Junius Finner Authorized  by: Donna Christen A Consent: Verbal consent obtained. Risks and benefits: risks, benefits and alternatives were discussed Consent given by: patient Patient understanding: patient states understanding of the procedure being performed Site marked: the operative site was marked Required items: required blood products, implants, devices, and special equipment available Patient identity confirmed: verbally with patient Type: hematoma Body area: anogenital Location details: perianal Anesthesia: local infiltration Local anesthetic: lidocaine 1% without epinephrine Anesthetic total: 1 ml Patient sedated: no Scalpel size: 11 Incision type: single straight Incision depth: subcutaneous Complexity: complex Drainage: bloody (2 blood clots) Drainage amount: moderate Wound treatment: wound left open Packing material: none Patient tolerance: Patient tolerated the procedure well with no immediate complications   (including critical care time)  Labs Review Labs Reviewed - No data to display  Imaging Review No results found.     MDM   1. External hemorrhoids   2. Internal hemorrhoids    Pt presenting to [facility] with c/o painful hemorrhoids for 3-4 days.  Discussed treatment options. She has already tried conservative OTC treatments w/o relief.   Discussed going to general surgeon. Pt would like to try I&D at [facility] today. Will go to general surgeon if needed.   I&D performed- 2 blood clots obtained from larger hemorrhoid. Tenderness and swelling still present.  No surrounding erythema or warmth. Low concern for abscess at this time.   Internal hemorrhoid palpated after I&D.   Encouraged pt to f/u with general surgery for further evaluation and treatment. Home care instructions provided. Rx: norco (10 tabs) discussed use of Sitz bath. Pt has f/u with OB/GYN on Wednesday, 5/17. Encouraged to keep that appointment for her lower abdominal pain/cysts. Patient verbalized understanding and agreement  with treatment plan.     Junius Finner, PA-C 10/10/15 [phone]

## 2015-10-08 NOTE — ED Notes (Signed)
Patient here for flare of hemorrhoid after diarrhea for a couple days; so painful she cannot sit down comfortably. She also states her inguinal area cysts also seem to be worse.

## 2015-10-08 NOTE — Discharge Instructions (Signed)
Vicodin/Norco (hydrocodone-acetaminophen) is a narcotic pain medication, do not combine these medications with others containing tylenol. While taking, do not drink alcohol, drive, or perform any other activities that requires focus while taking these medications.

## 2015-10-10 ENCOUNTER — Telehealth: Payer: Self-pay | Admitting: *Deleted

## 2015-10-10 MED ORDER — TRAMADOL HCL 50 MG PO TABS: 50.0000 mg | ORAL_TABLET | Freq: Four times a day (QID) | ORAL | Status: AC | PRN

## 2015-10-12 ENCOUNTER — Encounter: Payer: Managed Care, Other (non HMO) | Admitting: Obstetrics & Gynecology

## 2015-11-02 ENCOUNTER — Encounter: Payer: Managed Care, Other (non HMO) | Admitting: Obstetrics & Gynecology

## 2015-11-02 DIAGNOSIS — Z01419 Encounter for gynecological examination (general) (routine) without abnormal findings: Secondary | ICD-10-CM

## 2017-11-13 IMAGING — US US PELVIS COMPLETE
1 series · 13 of 25 positions shown · non-contrast
Comparison: No recent prior.

CLINICAL DATA: Right lower quadrant pain .

EXAM:
TRANSABDOMINAL AND TRANSVAGINAL ULTRASOUND OF PELVIS
TECHNIQUE: Both transabdominal and transvaginal ultrasound examinations of the
pelvis were performed. Transabdominal technique was performed for
global imaging of the pelvis including uterus, ovaries, adnexal
regions, and pelvic cul-de-sac. It was necessary to proceed with
endovaginal exam following the transabdominal exam to visualize the
uterus and ovaries.

[Series 1: us pelvis complete · 0.14mm/px · 13 of 190 slices shown]
[im 1/190]
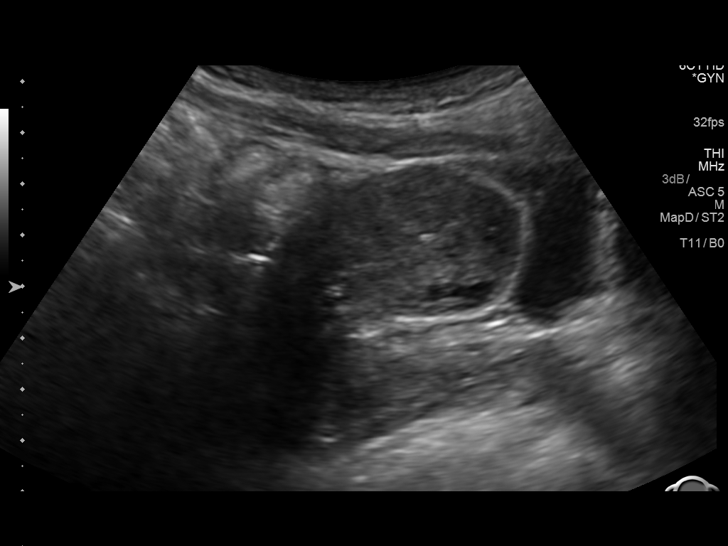
[im 16/190]
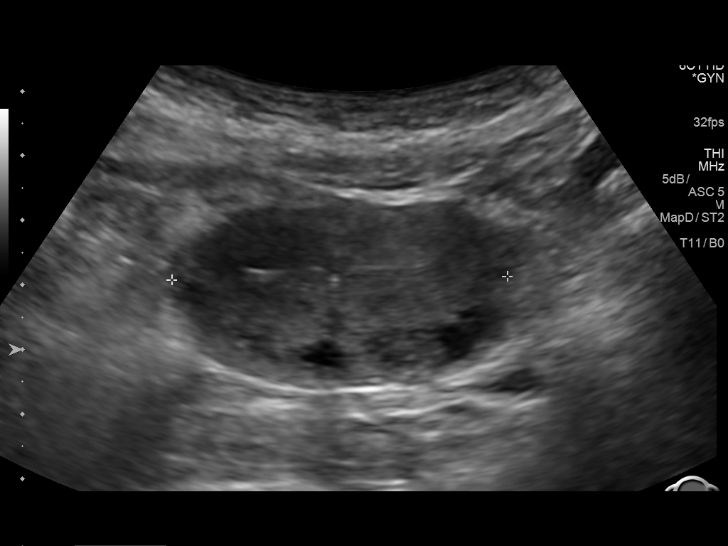
[im 32/190]
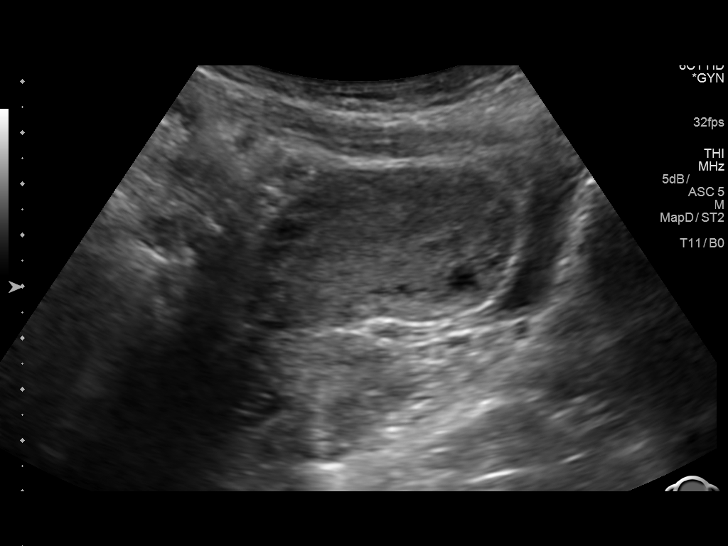
[im 48/190]
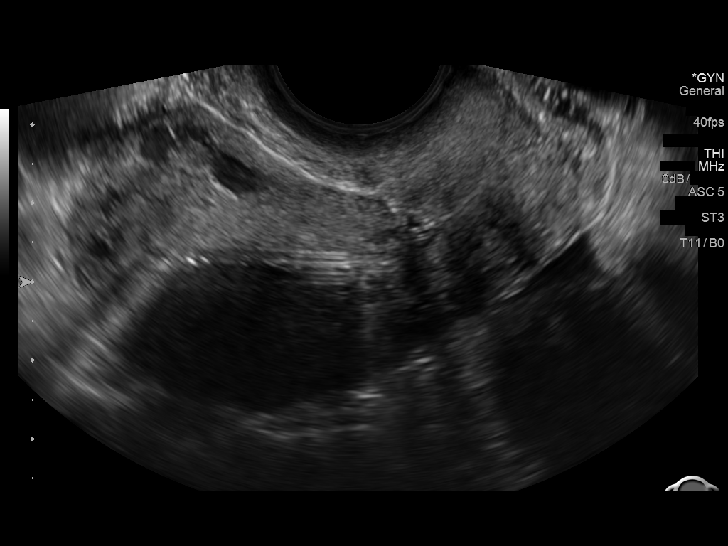
[im 64/190]
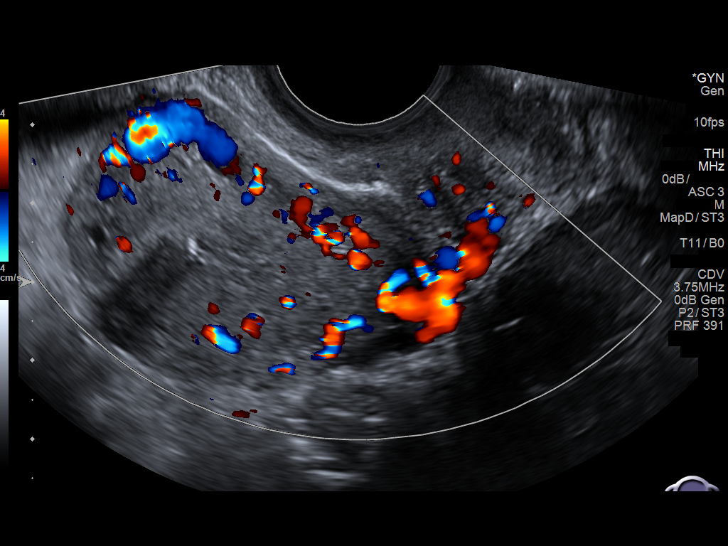
[im 79/190]
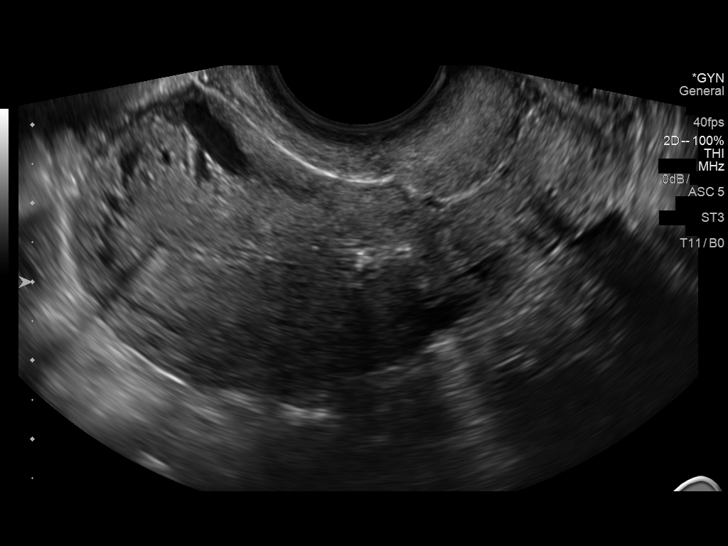
[im 95/190]
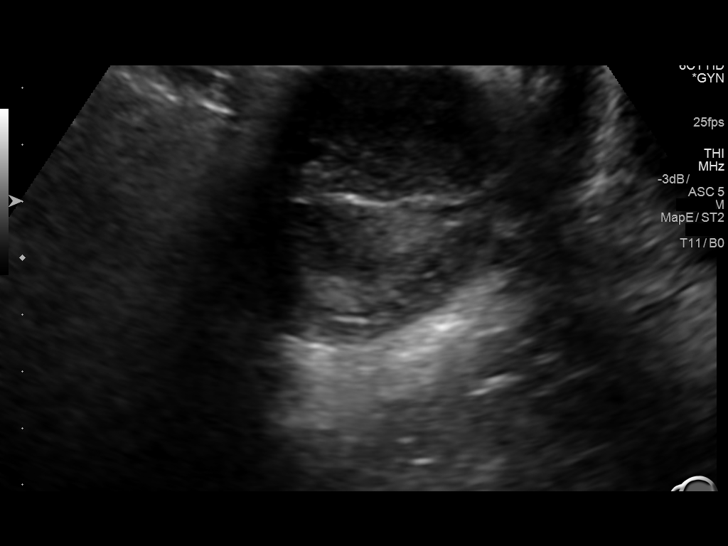
[im 111/190]
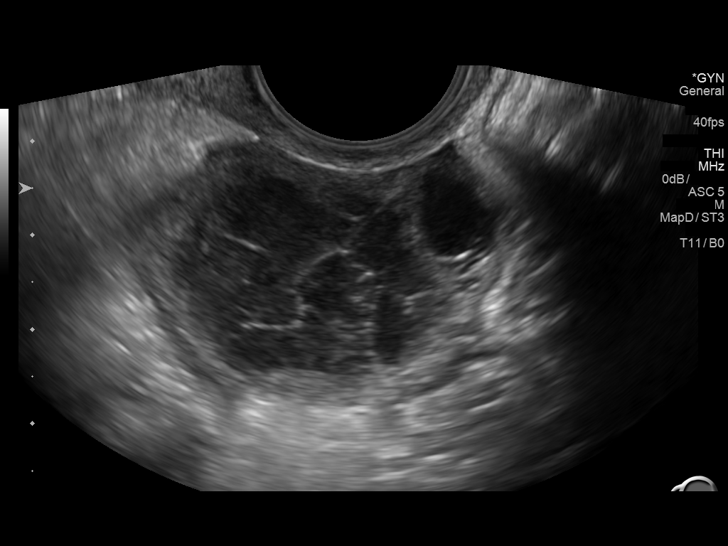
[im 127/190]
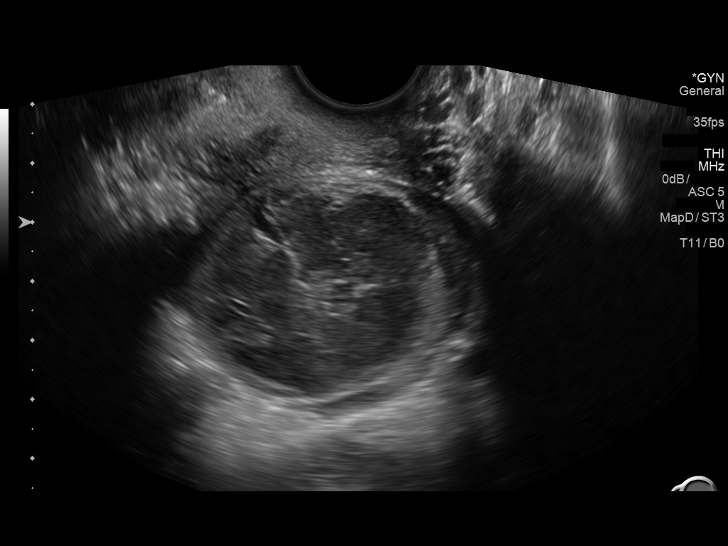
[im 142/190]
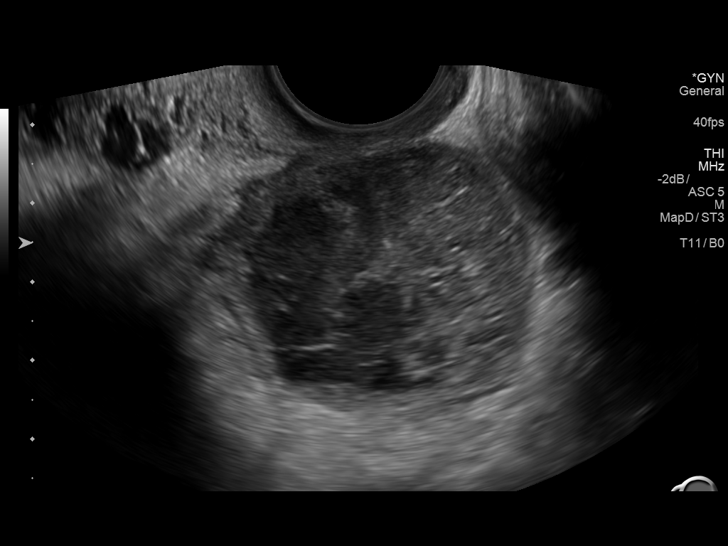
[im 158/190]
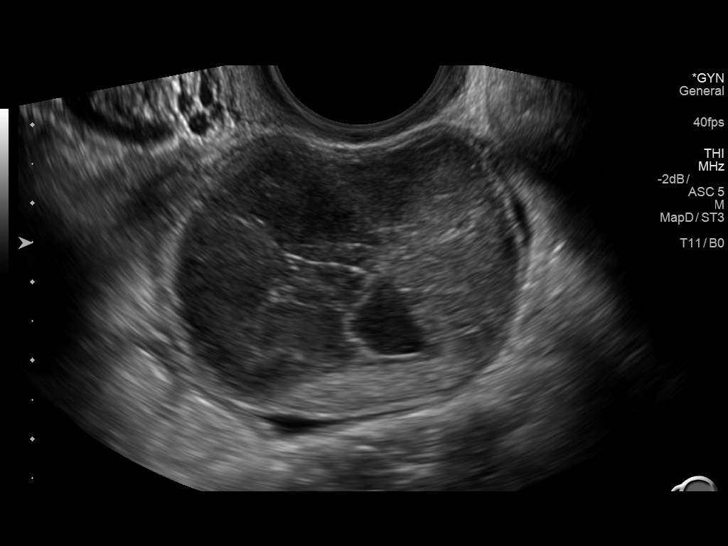
[im 174/190]
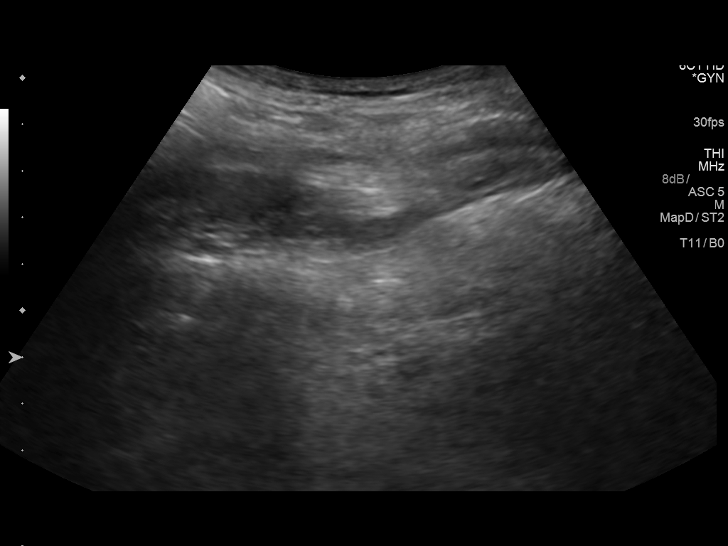
[im 190/190]
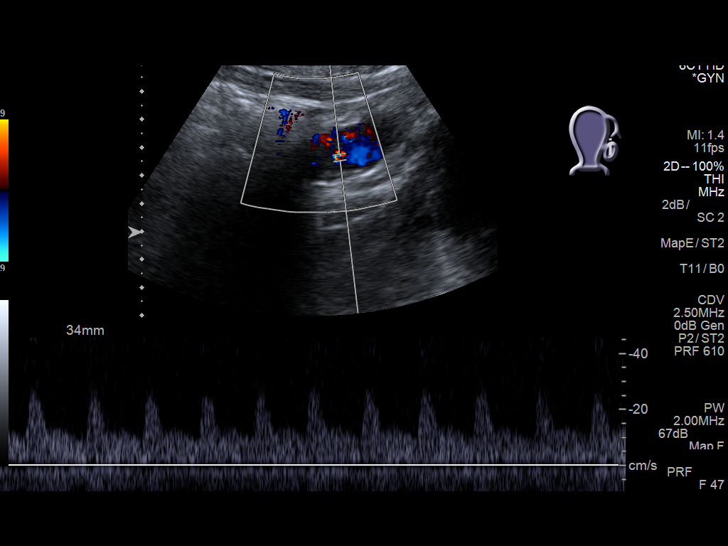

[13 of 25 positions shown; findings below may reference images not displayed]

FINDINGS: Uterus

Measurements: 6.7 x 3.1 x 5.8 cm. No fibroids or other mass
visualized.

Endometrium

Thickness: 6.9 mm.  IUD noted in the endometrial canal.

Right ovary

Measurements: 6.0 x 3.3 x 5.2 cm. 4.4 x 3.2 x 3.9 cm complex cystic
mass.

Left ovary

Measurements: 3.8 x 1.9 x 1.6 cm. Normal appearance/no adnexal mass.

Other findings

No abnormal free fluid.
IMPRESSION: 1. 4.4 x 3.2 x 3.9 cm right ovarian complex cystic mass. Pregnancy
test suggested to exclude ectopic pregnancy. This could be a benign
process such as hemorrhagic cyst however follow-up is needed to
exclude a persistent mass lesion. Short-interval follow up
ultrasound in 6-12 weeks is recommended, preferably during the week
following the patient's normal menses.

2.  IUD noted in the endometrial canal.

## 2017-12-01 IMAGING — US US TRANSVAGINAL NON-OB
1 series · 13 of 25 positions shown · non-contrast
Comparison: Pelvic ultrasound September 02, 2015

CLINICAL DATA: Follow-up of right ovarian cyst. The patient is
reporting increased abdominal pain with nausea and vomiting; history
of previous Cesarean section, splenectomy, appendectomy. The patient
has an IUD.

EXAM:
TRANSABDOMINAL ULTRASOUND OF PELVIS
TECHNIQUE: Transabdominal ultrasound examination of the pelvis was performed
including evaluation of the uterus, ovaries, adnexal regions, and
pelvic cul-de-sac.

[Series 1: us transvaginal non-ob · 0.14mm/px · 13 of 120 slices shown]
[im 1/120]
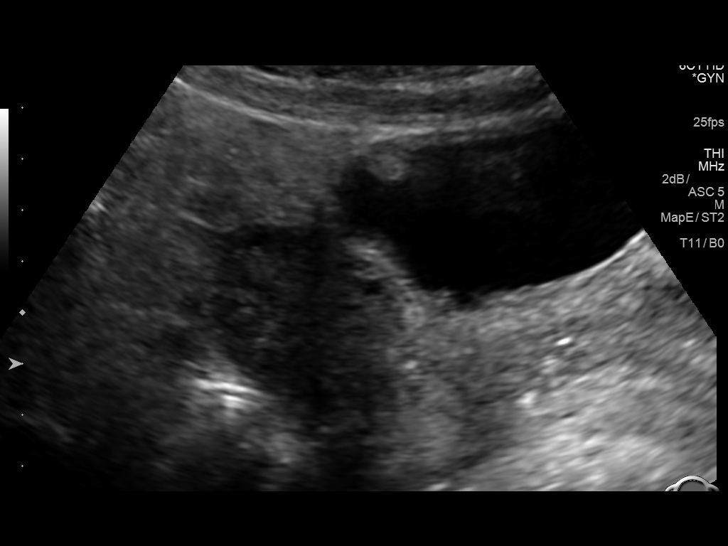
[im 10/120]
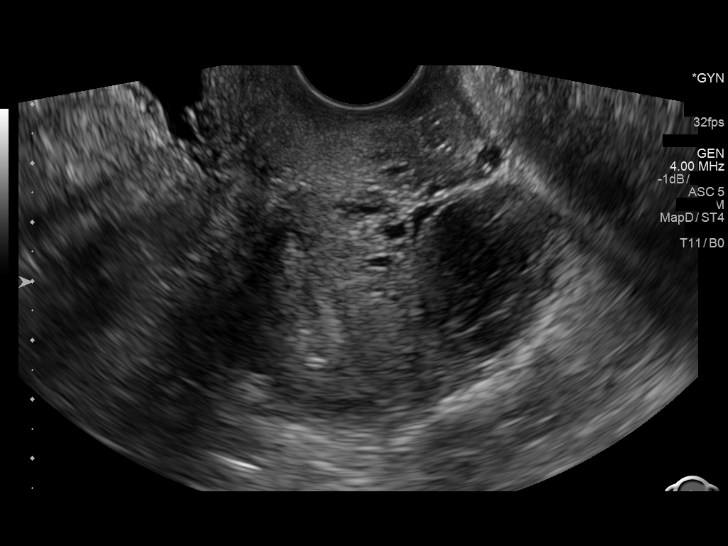
[im 20/120]
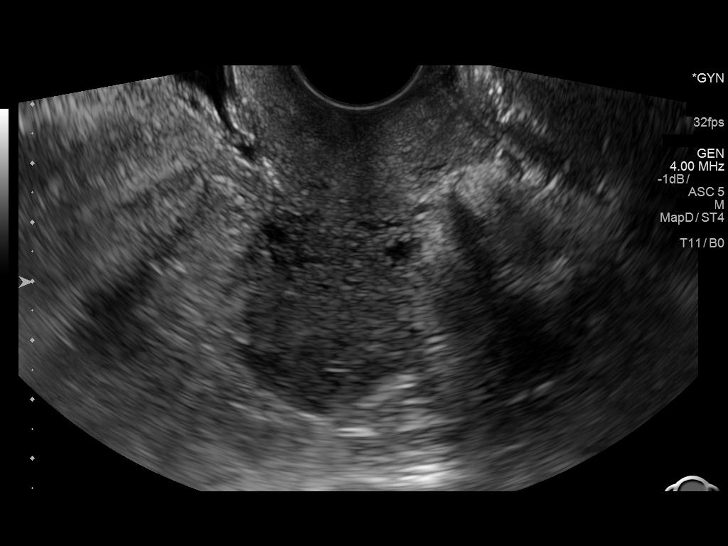
[im 30/120]
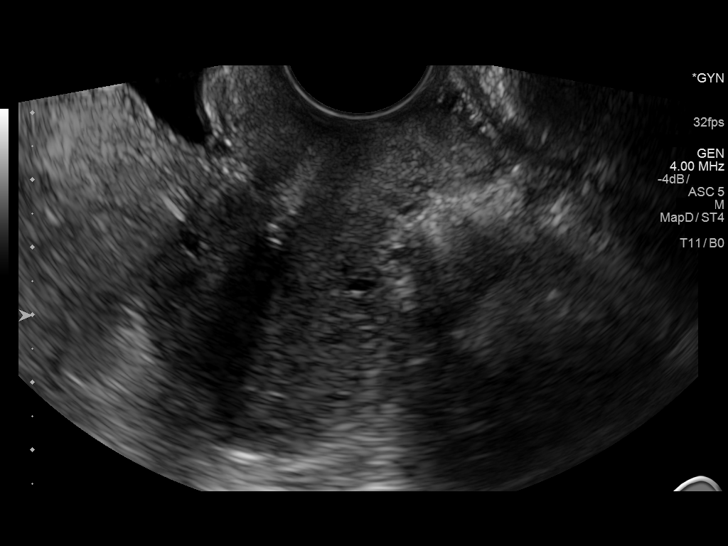
[im 40/120]
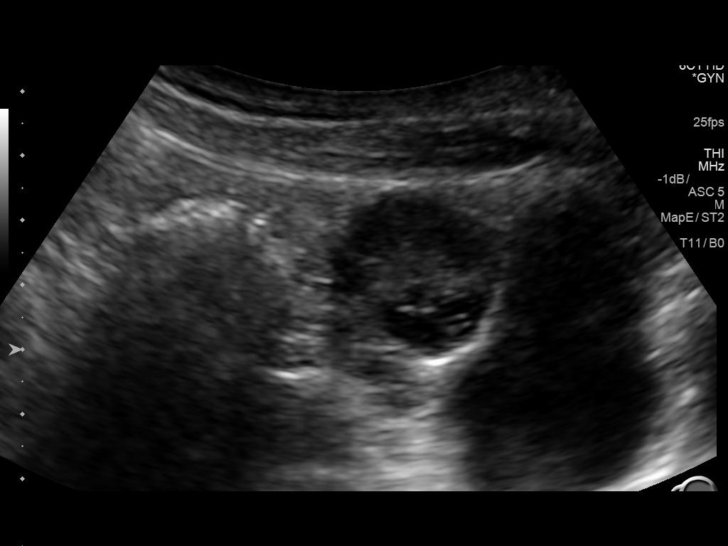
[im 50/120]
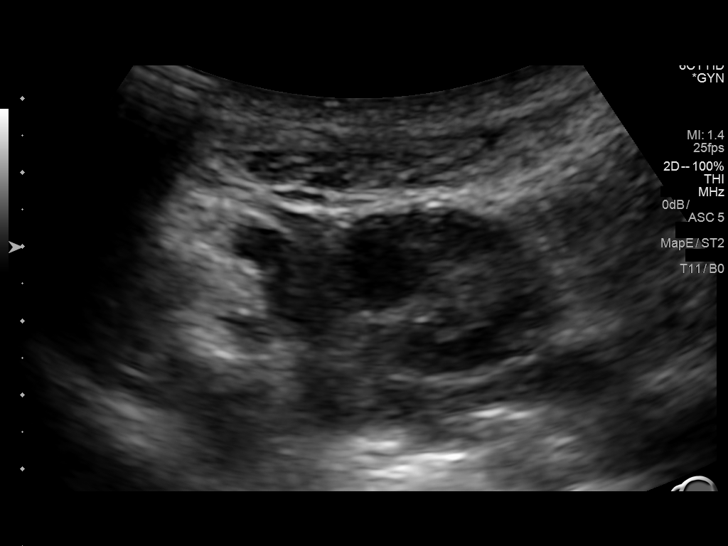
[im 60/120]
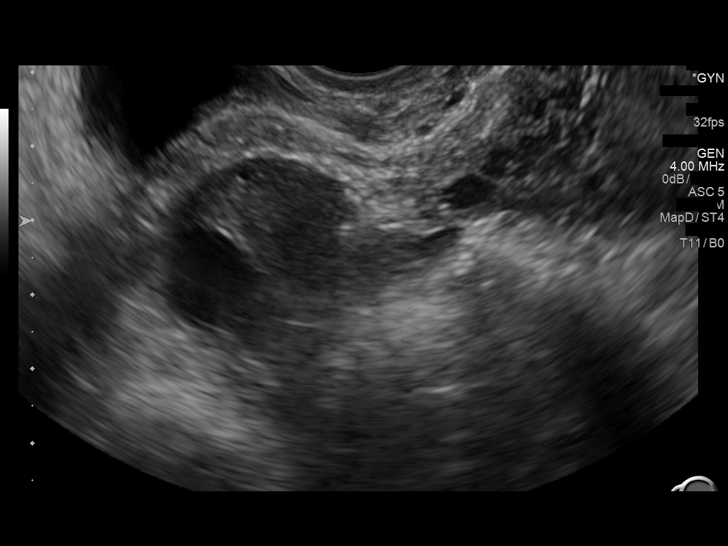
[im 70/120]
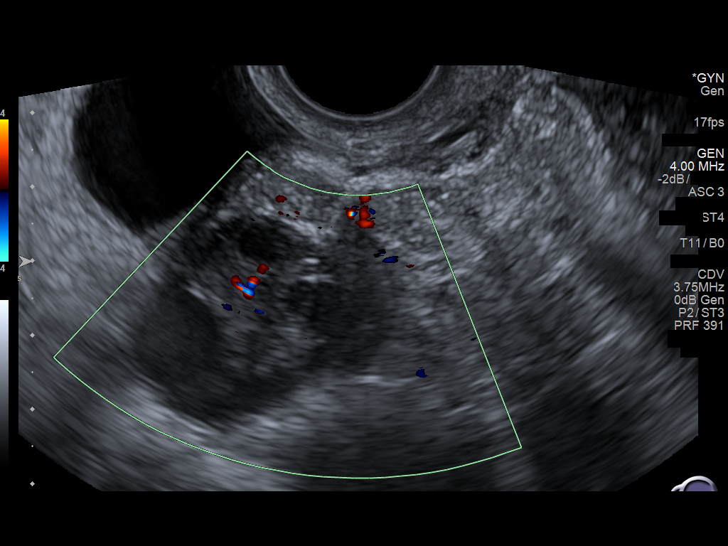
[im 80/120]
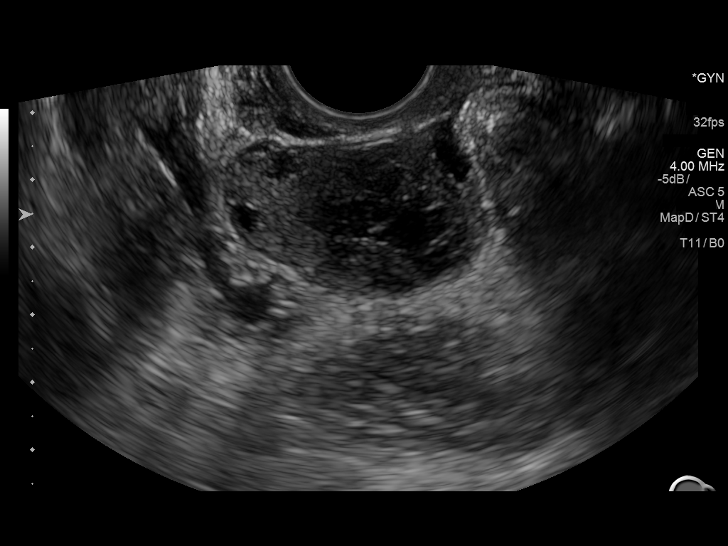
[im 90/120]
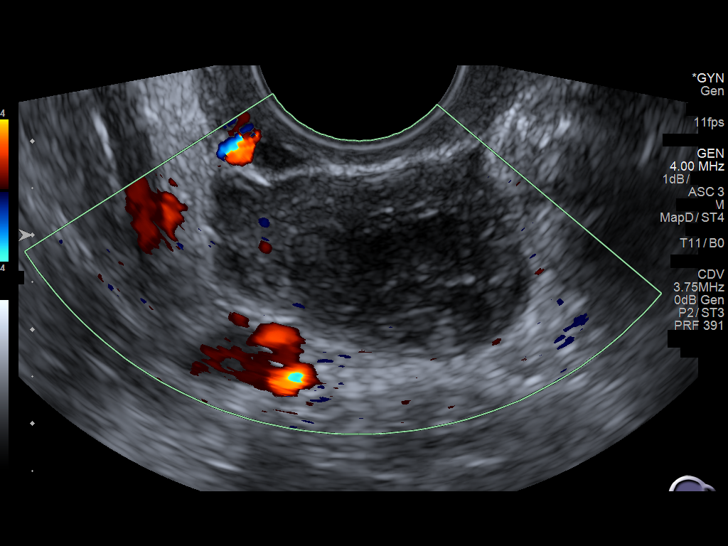
[im 100/120]
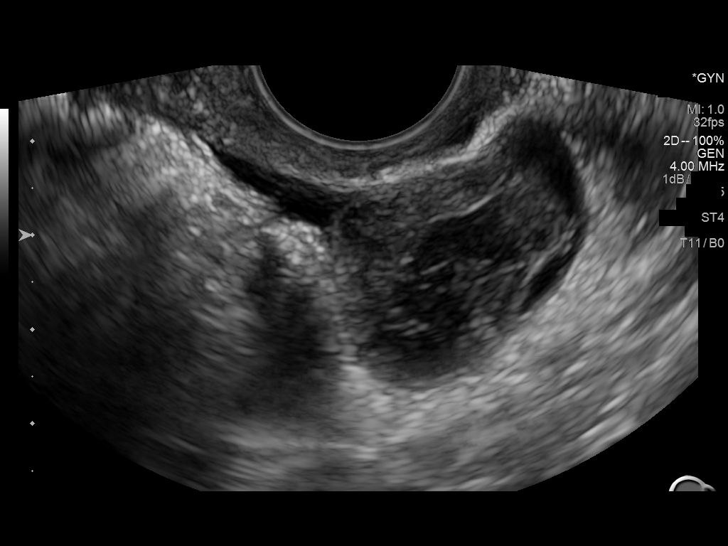
[im 110/120]
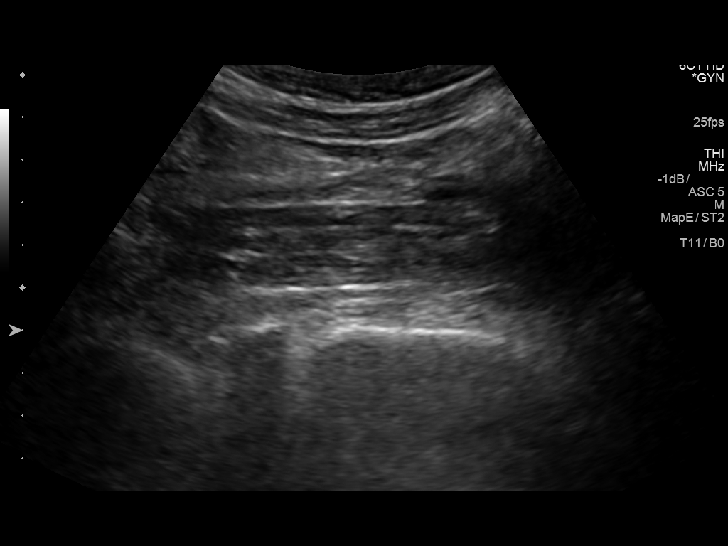
[im 120/120]
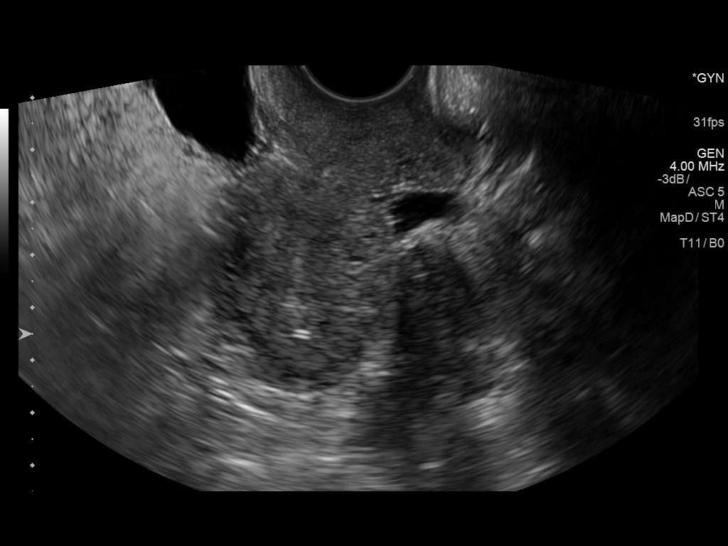

[13 of 25 positions shown; findings below may reference images not displayed]

FINDINGS: The study is limited overall due to a large amount of bowel gas
within the pelvis.

Uterus

Measurements: 6.3 x 3.3 x 4.4 cm. The IUD is not discretely
demonstrated. No fibroids or other mass visualized.

Endometrium

Thickness: 3.9 mm. No abnormal endometrial fluid collections or
masses are observed.

Right ovary

Measurements: 4.5 x 3.4 x 4.2 cm. Again demonstrated is a complex
cystic structure measuring 2.9 x 2.7 x 2.7 cm. This has decreased in
size since the previous study..

Left ovary

Measurements: 4.0 x 2.3 x 2.9 cm. There is a cystic structure
demonstrating internal echoes measuring 2.6 x 2.1 x 2.1 cm. This was
not demonstrated on the previous study.

Other findings:  There is small amount of free pelvic fluid.
IMPRESSION: 1. The study is limited overall due to excessive bowel gas within
the pelvis.
2. Decreased size of the complex cystic structure in the right ovary
sets and it now measures 2.9 x 2.7 x 2.7 cm. This likely reflects a
resolving hemorrhagic cyst.
3. New cystic structure with internal echoes in the left ovary
measuring 2.6 x 2.1 x 2.1 cm. This may reflect a new hemorrhagic
cyst.
4. Small amount of free pelvic fluid.
5. Follow-up ultrasound in 6-12 weeks is recommended.
6. Given the patient's current abdominal and pelvic discomfort
associated with nausea vomiting and diarrhea, abdominal and pelvic
CT scanning may be a useful next imaging step to exclude non gyn
etiologies.
